# Patient Record
Sex: Female | Born: 1960 | Race: White | Hispanic: No | Smoking: Former smoker
Health system: Southern US, Community
[De-identification: ages and names within clinical notes are randomized; demographics above are authoritative.]

## PROBLEM LIST (undated history)

## (undated) DIAGNOSIS — K219 Gastro-esophageal reflux disease without esophagitis: Secondary | ICD-10-CM

## (undated) DIAGNOSIS — F32A Depression, unspecified: Secondary | ICD-10-CM

## (undated) DIAGNOSIS — C801 Malignant (primary) neoplasm, unspecified: Secondary | ICD-10-CM

## (undated) DIAGNOSIS — F319 Bipolar disorder, unspecified: Secondary | ICD-10-CM

## (undated) DIAGNOSIS — I639 Cerebral infarction, unspecified: Secondary | ICD-10-CM

## (undated) DIAGNOSIS — F329 Major depressive disorder, single episode, unspecified: Secondary | ICD-10-CM

## (undated) DIAGNOSIS — F419 Anxiety disorder, unspecified: Secondary | ICD-10-CM

## (undated) DIAGNOSIS — Z972 Presence of dental prosthetic device (complete) (partial): Secondary | ICD-10-CM

## (undated) HISTORY — PX: ABDOMINAL HYSTERECTOMY: SHX81

## (undated) HISTORY — PX: TONSILLECTOMY: SUR1361

## (undated) HISTORY — PX: BREAST SURGERY: SHX581

## (undated) HISTORY — PX: APPENDECTOMY: SHX54

## (undated) HISTORY — PX: CARPAL TUNNEL RELEASE: SHX101

## (undated) HISTORY — PX: SHOULDER ARTHROSCOPY W/ ROTATOR CUFF REPAIR: SHX2400

---

## 2001-02-13 ENCOUNTER — Inpatient Hospital Stay (HOSPITAL_COMMUNITY): Admission: EM | Admit: 2001-02-13 | Discharge: 2001-02-14 | Payer: Self-pay | Admitting: *Deleted

## 2001-02-13 ENCOUNTER — Encounter: Payer: Self-pay | Admitting: Emergency Medicine

## 2001-02-14 ENCOUNTER — Encounter: Payer: Self-pay | Admitting: Cardiology

## 2001-04-01 ENCOUNTER — Ambulatory Visit (HOSPITAL_COMMUNITY): Admission: RE | Admit: 2001-04-01 | Discharge: 2001-04-01 | Payer: Self-pay | Admitting: Cardiology

## 2001-04-01 ENCOUNTER — Encounter: Payer: Self-pay | Admitting: Cardiology

## 2001-12-06 ENCOUNTER — Encounter: Payer: Self-pay | Admitting: Emergency Medicine

## 2001-12-06 ENCOUNTER — Emergency Department (HOSPITAL_COMMUNITY): Admission: EM | Admit: 2001-12-06 | Discharge: 2001-12-06 | Payer: Self-pay | Admitting: Emergency Medicine

## 2001-12-08 ENCOUNTER — Encounter: Payer: Self-pay | Admitting: Orthopedic Surgery

## 2001-12-08 ENCOUNTER — Ambulatory Visit (HOSPITAL_COMMUNITY): Admission: RE | Admit: 2001-12-08 | Discharge: 2001-12-08 | Payer: Self-pay | Admitting: Orthopedic Surgery

## 2001-12-10 ENCOUNTER — Emergency Department (HOSPITAL_COMMUNITY): Admission: EM | Admit: 2001-12-10 | Discharge: 2001-12-10 | Payer: Self-pay | Admitting: Emergency Medicine

## 2001-12-21 ENCOUNTER — Ambulatory Visit (HOSPITAL_BASED_OUTPATIENT_CLINIC_OR_DEPARTMENT_OTHER): Admission: RE | Admit: 2001-12-21 | Discharge: 2001-12-21 | Payer: Self-pay | Admitting: Orthopedic Surgery

## 2004-09-06 ENCOUNTER — Inpatient Hospital Stay (HOSPITAL_COMMUNITY): Admission: AD | Admit: 2004-09-06 | Discharge: 2004-09-06 | Payer: Self-pay | Admitting: *Deleted

## 2004-09-11 ENCOUNTER — Encounter: Admission: RE | Admit: 2004-09-11 | Discharge: 2004-09-11 | Payer: Self-pay | Admitting: Internal Medicine

## 2004-10-06 ENCOUNTER — Inpatient Hospital Stay (HOSPITAL_COMMUNITY): Admission: EM | Admit: 2004-10-06 | Discharge: 2004-10-08 | Payer: Self-pay | Admitting: Emergency Medicine

## 2006-03-17 IMAGING — CR DG CHEST 1V PORT
1 series · 1 of 1 positions shown · non-contrast
Comparison: none

CLINICAL DATA: Chest pain, shortness of breath, smoking history.  
 CHEST PORTABLE - 1 VIEW - 9741 HOURS:
 Artifact overlies the chest.  Heart size is normal.  The mediastinum is unremarkable.  The left lung is clear.  Question if there is density in the right upper lobe.  This could be infiltrate or mass.  Two view chest radiography recommended when possible.

[view not recorded]
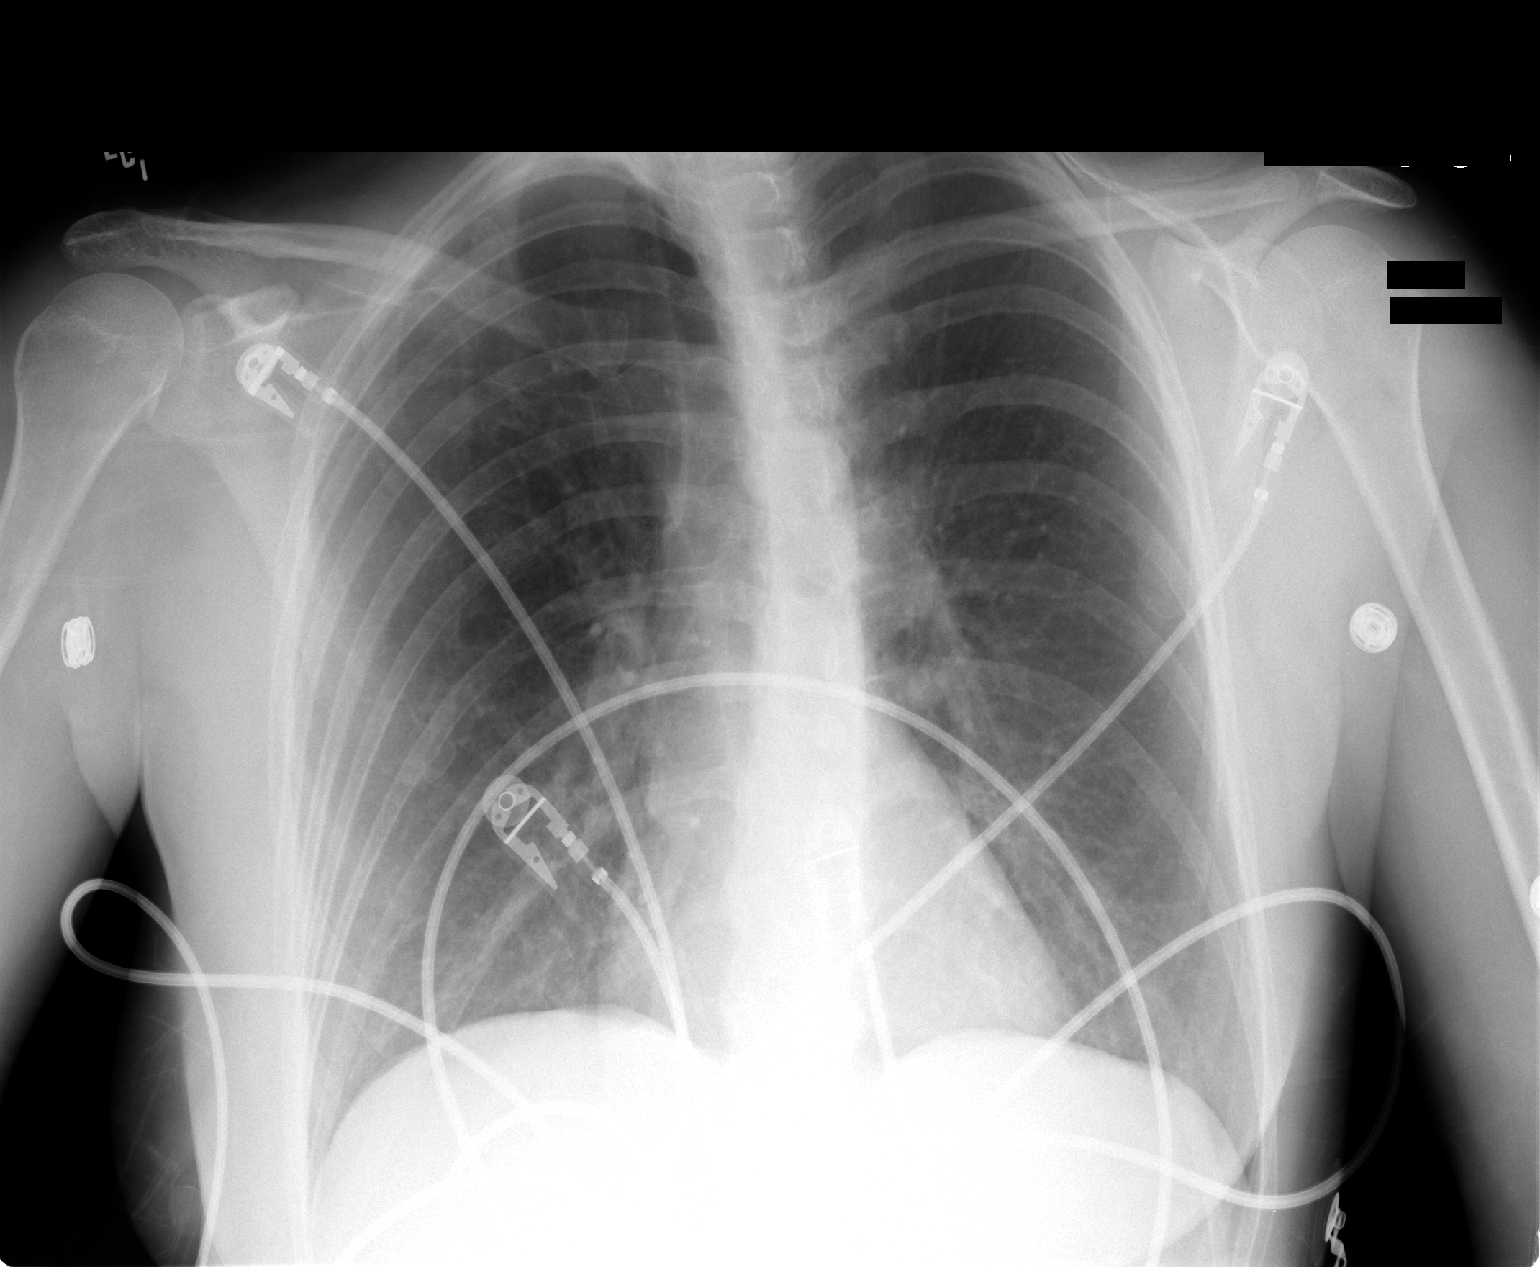

[1 of 1 positions shown; findings below may reference images not displayed]

## 2013-11-23 ENCOUNTER — Encounter (HOSPITAL_BASED_OUTPATIENT_CLINIC_OR_DEPARTMENT_OTHER): Payer: Self-pay | Admitting: *Deleted

## 2013-11-28 ENCOUNTER — Encounter (HOSPITAL_BASED_OUTPATIENT_CLINIC_OR_DEPARTMENT_OTHER)
Admission: RE | Disposition: A | Discharge status: Home / Self Care | Payer: Self-pay | Source: Ambulatory Visit | Attending: Specialist

## 2013-11-28 ENCOUNTER — Encounter (HOSPITAL_BASED_OUTPATIENT_CLINIC_OR_DEPARTMENT_OTHER): Payer: Medicare Other | Admitting: Anesthesiology

## 2013-11-28 ENCOUNTER — Ambulatory Visit (HOSPITAL_BASED_OUTPATIENT_CLINIC_OR_DEPARTMENT_OTHER): Payer: Medicare Other | Admitting: Anesthesiology

## 2013-11-28 ENCOUNTER — Ambulatory Visit (HOSPITAL_BASED_OUTPATIENT_CLINIC_OR_DEPARTMENT_OTHER)
Admission: RE | Admit: 2013-11-28 | Discharge: 2013-11-28 | Disposition: A | Discharge status: Home / Self Care | Payer: Medicare Other | Source: Ambulatory Visit | Attending: Specialist | Admitting: Specialist

## 2013-11-28 ENCOUNTER — Encounter (HOSPITAL_BASED_OUTPATIENT_CLINIC_OR_DEPARTMENT_OTHER): Payer: Self-pay

## 2013-11-28 DIAGNOSIS — Z9010 Acquired absence of unspecified breast and nipple: Secondary | ICD-10-CM | POA: Insufficient documentation

## 2013-11-28 DIAGNOSIS — F411 Generalized anxiety disorder: Secondary | ICD-10-CM | POA: Insufficient documentation

## 2013-11-28 DIAGNOSIS — F3289 Other specified depressive episodes: Secondary | ICD-10-CM | POA: Insufficient documentation

## 2013-11-28 DIAGNOSIS — F329 Major depressive disorder, single episode, unspecified: Secondary | ICD-10-CM | POA: Insufficient documentation

## 2013-11-28 DIAGNOSIS — Z87891 Personal history of nicotine dependence: Secondary | ICD-10-CM | POA: Insufficient documentation

## 2013-11-28 DIAGNOSIS — Z853 Personal history of malignant neoplasm of breast: Secondary | ICD-10-CM | POA: Insufficient documentation

## 2013-11-28 DIAGNOSIS — K219 Gastro-esophageal reflux disease without esophagitis: Secondary | ICD-10-CM | POA: Insufficient documentation

## 2013-11-28 DIAGNOSIS — N650 Deformity of reconstructed breast: Secondary | ICD-10-CM | POA: Insufficient documentation

## 2013-11-28 HISTORY — DX: Depression, unspecified: F32.A

## 2013-11-28 HISTORY — DX: Anxiety disorder, unspecified: F41.9

## 2013-11-28 HISTORY — DX: Bipolar disorder, unspecified: F31.9

## 2013-11-28 HISTORY — PX: BREAST CAPSULECTOMY WITH IMPLANT EXCHANGE: SHX5592

## 2013-11-28 HISTORY — DX: Gastro-esophageal reflux disease without esophagitis: K21.9

## 2013-11-28 HISTORY — DX: Major depressive disorder, single episode, unspecified: F32.9

## 2013-11-28 HISTORY — DX: Malignant (primary) neoplasm, unspecified: C80.1

## 2013-11-28 LAB — POCT HEMOGLOBIN-HEMACUE: Hemoglobin: 12.1 g/dL (ref 12.0–15.0)

## 2013-11-28 SURGERY — CAPSULECTOMY, BREAST, WITH REPLACEMENT OF IMPLANT
Anesthesia: General | Site: Breast | Laterality: Bilateral

## 2013-11-28 MED ORDER — MIDAZOLAM HCL 5 MG/5ML IJ SOLN
INTRAMUSCULAR | Status: DC | PRN
  Administered 2013-11-28: 1 mg via INTRAVENOUS

## 2013-11-28 MED ORDER — ROCURONIUM BROMIDE 100 MG/10ML IV SOLN
INTRAVENOUS | Status: DC | PRN
  Administered 2013-11-28: 30 mg via INTRAVENOUS

## 2013-11-28 MED ORDER — MIDAZOLAM HCL 2 MG/2ML IJ SOLN
INTRAMUSCULAR | Status: AC
  Filled 2013-11-28: qty 2

## 2013-11-28 MED ORDER — GLYCOPYRROLATE 0.2 MG/ML IJ SOLN
INTRAMUSCULAR | Status: DC | PRN
  Administered 2013-11-28: .6 mg via INTRAVENOUS

## 2013-11-28 MED ORDER — OXYCODONE HCL 5 MG/5ML PO SOLN: 5.0000 mg | Freq: Once | ORAL | Status: AC | PRN

## 2013-11-28 MED ORDER — NEOSTIGMINE METHYLSULFATE 10 MG/10ML IV SOLN
INTRAVENOUS | Status: DC | PRN
  Administered 2013-11-28: 4 mg via INTRAVENOUS

## 2013-11-28 MED ORDER — FENTANYL CITRATE 0.05 MG/ML IJ SOLN
INTRAMUSCULAR | Status: AC
  Filled 2013-11-28: qty 6

## 2013-11-28 MED ORDER — HYDROMORPHONE HCL PF 1 MG/ML IJ SOLN
INTRAMUSCULAR | Status: AC
  Filled 2013-11-28: qty 1

## 2013-11-28 MED ORDER — SODIUM CHLORIDE 0.9 % IV SOLN
INTRAVENOUS | Status: DC | PRN
  Administered 2013-11-28: 50 mL via INTRAMUSCULAR

## 2013-11-28 MED ORDER — ONDANSETRON HCL 4 MG/2ML IJ SOLN
INTRAMUSCULAR | Status: DC | PRN
  Administered 2013-11-28: 4 mg via INTRAVENOUS

## 2013-11-28 MED ORDER — PROPOFOL 10 MG/ML IV BOLUS
INTRAVENOUS | Status: DC | PRN
  Administered 2013-11-28: 200 mg via INTRAVENOUS

## 2013-11-28 MED ORDER — CEFAZOLIN SODIUM-DEXTROSE 2-3 GM-% IV SOLR
INTRAVENOUS | Status: AC
  Filled 2013-11-28: qty 50

## 2013-11-28 MED ORDER — LIDOCAINE-EPINEPHRINE 0.5 %-1:200000 IJ SOLN
INTRAMUSCULAR | Status: DC | PRN
Start: 2013-11-28 — End: 2013-11-28
  Administered 2013-11-28: 50 mL

## 2013-11-28 MED ORDER — LACTATED RINGERS IV SOLN
INTRAVENOUS | Status: DC
  Administered 2013-11-28 (×2): via INTRAVENOUS

## 2013-11-28 MED ORDER — FENTANYL CITRATE 0.05 MG/ML IJ SOLN
INTRAMUSCULAR | Status: DC | PRN
Start: 2013-11-28 — End: 2013-11-28
  Administered 2013-11-28: 100 ug via INTRAVENOUS

## 2013-11-28 MED ORDER — HYDROMORPHONE HCL PF 1 MG/ML IJ SOLN
0.2500 mg | INTRAMUSCULAR | Status: DC | PRN
Start: 2013-11-28 — End: 2013-11-28
  Administered 2013-11-28 (×4): 0.5 mg via INTRAVENOUS

## 2013-11-28 MED ORDER — OXYCODONE HCL 5 MG PO TABS
ORAL_TABLET | ORAL | Status: AC
  Filled 2013-11-28: qty 1

## 2013-11-28 MED ORDER — CEFAZOLIN SODIUM-DEXTROSE 2-3 GM-% IV SOLR
2.0000 g | INTRAVENOUS | Status: AC
  Administered 2013-11-28: 2 g via INTRAVENOUS

## 2013-11-28 MED ORDER — LIDOCAINE HCL (CARDIAC) 20 MG/ML IV SOLN
INTRAVENOUS | Status: DC | PRN
  Administered 2013-11-28: 80 mg via INTRAVENOUS

## 2013-11-28 MED ORDER — DEXAMETHASONE SODIUM PHOSPHATE 4 MG/ML IJ SOLN
INTRAMUSCULAR | Status: DC | PRN
  Administered 2013-11-28: 10 mg via INTRAVENOUS

## 2013-11-28 MED ORDER — MIDAZOLAM HCL 2 MG/2ML IJ SOLN: 1.0000 mg | INTRAMUSCULAR | Status: DC | PRN

## 2013-11-28 MED ORDER — ONDANSETRON HCL 4 MG/2ML IJ SOLN: 4.0000 mg | Freq: Once | INTRAMUSCULAR | Status: DC | PRN

## 2013-11-28 MED ORDER — FENTANYL CITRATE 0.05 MG/ML IJ SOLN: 50.0000 ug | INTRAMUSCULAR | Status: DC | PRN

## 2013-11-28 MED ORDER — OXYCODONE HCL 5 MG PO TABS
5.0000 mg | ORAL_TABLET | Freq: Once | ORAL | Status: AC | PRN
  Administered 2013-11-28: 5 mg via ORAL

## 2013-11-28 SURGICAL SUPPLY — 63 items
BAG DECANTER FOR FLEXI CONT (MISCELLANEOUS) ×3 IMPLANT
BENZOIN TINCTURE PRP APPL 2/3 (GAUZE/BANDAGES/DRESSINGS) ×3 IMPLANT
BLADE KNIFE PERSONA 10 (BLADE) ×3 IMPLANT
BLADE KNIFE PERSONA 15 (BLADE) ×3 IMPLANT
CANISTER SUCT 1200ML W/VALVE (MISCELLANEOUS) ×3 IMPLANT
COVER MAYO STAND STRL (DRAPES) ×3 IMPLANT
COVER TABLE BACK 60X90 (DRAPES) ×3 IMPLANT
DECANTER SPIKE VIAL GLASS SM (MISCELLANEOUS) ×3 IMPLANT
DRAIN CHANNEL 10M FLAT 3/4 FLT (DRAIN) ×3 IMPLANT
DRAIN PENROSE 1/4X12 LTX STRL (WOUND CARE) IMPLANT
DRAPE LAPAROSCOPIC ABDOMINAL (DRAPES) ×3 IMPLANT
DRSG PAD ABDOMINAL 8X10 ST (GAUZE/BANDAGES/DRESSINGS) ×6 IMPLANT
ELECT BLADE 6.5 .24CM SHAFT (ELECTRODE) IMPLANT
ELECT REM PT RETURN 9FT ADLT (ELECTROSURGICAL) ×3
ELECTRODE REM PT RTRN 9FT ADLT (ELECTROSURGICAL) ×1 IMPLANT
EVACUATOR SILICONE 100CC (DRAIN) ×3 IMPLANT
FILTER 7/8 IN (FILTER) ×3 IMPLANT
GAUZE SPONGE 4X4 12PLY STRL (GAUZE/BANDAGES/DRESSINGS) ×3 IMPLANT
GAUZE XEROFORM 5X9 LF (GAUZE/BANDAGES/DRESSINGS) ×3 IMPLANT
GLOVE BIO SURGEON STRL SZ 6.5 (GLOVE) ×2 IMPLANT
GLOVE BIO SURGEONS STRL SZ 6.5 (GLOVE) ×1
GLOVE BIOGEL M STRL SZ7.5 (GLOVE) ×3 IMPLANT
GLOVE BIOGEL PI IND STRL 8 (GLOVE) ×1 IMPLANT
GLOVE BIOGEL PI INDICATOR 8 (GLOVE) ×2
GLOVE ECLIPSE 7.0 STRL STRAW (GLOVE) ×3 IMPLANT
GOWN STRL REUS W/ TWL LRG LVL3 (GOWN DISPOSABLE) IMPLANT
GOWN STRL REUS W/TWL LRG LVL3 (GOWN DISPOSABLE)
GOWN STRL REUS W/TWL XL LVL3 (GOWN DISPOSABLE) ×6 IMPLANT
IMPL GEL HP 550CC (Breast) ×2 IMPLANT
IMPLANT GEL HP 550CC (Breast) ×6 IMPLANT
IV NS 500ML (IV SOLUTION) ×8
IV NS 500ML BAXH (IV SOLUTION) ×4 IMPLANT
KIT FILL SYSTEM UNIVERSAL (SET/KITS/TRAYS/PACK) IMPLANT
NDL SAFETY ECLIPSE 18X1.5 (NEEDLE) IMPLANT
NEEDLE HYPO 18GX1.5 SHARP (NEEDLE)
NEEDLE HYPO 25X1 1.5 SAFETY (NEEDLE) ×3 IMPLANT
NEEDLE SPNL 18GX3.5 QUINCKE PK (NEEDLE) IMPLANT
NS IRRIG 1000ML POUR BTL (IV SOLUTION) IMPLANT
PACK BASIN DAY SURGERY FS (CUSTOM PROCEDURE TRAY) ×3 IMPLANT
PEN SKIN MARKING BROAD TIP (MISCELLANEOUS) ×3 IMPLANT
PIN SAFETY STERILE (MISCELLANEOUS) IMPLANT
SLEEVE SCD COMPRESS KNEE MED (MISCELLANEOUS) ×3 IMPLANT
SPONGE LAP 18X18 X RAY DECT (DISPOSABLE) ×6 IMPLANT
STRIP SUTURE WOUND CLOSURE 1/2 (SUTURE) IMPLANT
SUT MNCRL AB 3-0 PS2 18 (SUTURE) ×6 IMPLANT
SUT MON AB 2-0 CT1 36 (SUTURE) ×6 IMPLANT
SUT MON AB 5-0 PS2 18 (SUTURE) IMPLANT
SUT PROLENE 3 0 PS 2 (SUTURE) IMPLANT
SYR 20CC LL (SYRINGE) IMPLANT
SYR 50ML LL SCALE MARK (SYRINGE) ×6 IMPLANT
SYR BULB IRRIGATION 50ML (SYRINGE) ×3 IMPLANT
SYR CONTROL 10ML LL (SYRINGE) ×3 IMPLANT
TAPE HYPAFIX 6 X30' (GAUZE/BANDAGES/DRESSINGS) ×1
TAPE HYPAFIX 6X30 (GAUZE/BANDAGES/DRESSINGS) ×2 IMPLANT
TAPE MEASURE 72IN RETRACT (INSTRUMENTS)
TAPE MEASURE LINEN 72IN RETRCT (INSTRUMENTS) IMPLANT
TOWEL OR 17X24 6PK STRL BLUE (TOWEL DISPOSABLE) ×12 IMPLANT
TRAY DSU PREP LF (CUSTOM PROCEDURE TRAY) ×3 IMPLANT
TUBE CONNECTING 20'X1/4 (TUBING) ×1
TUBE CONNECTING 20X1/4 (TUBING) ×2 IMPLANT
UNDERPAD 30X30 INCONTINENT (UNDERPADS AND DIAPERS) ×6 IMPLANT
VAC PENCILS W/TUBING CLEAR (MISCELLANEOUS) ×3 IMPLANT
YANKAUER SUCT BULB TIP NO VENT (SUCTIONS) ×3 IMPLANT

## 2013-11-28 NOTE — Discharge Instructions (Signed)
Activity (include date of return to work if known) As tolerated: NO showers NO driving No heavy activities  Diet:regular No restrictions:  Wound Care: Keep dressing clean & dry  Do not change dressings For Abdominoplasties wear abdominal binder Special Instructions: Do not raise arms up  . Call Doctor if any unusual problems occur such as pain, excessive Bleeding, unrelieved Nausea/vomiting, Fever &/or chills When lying down, keep head elevated on 2-3 pillows or back-rest  Follow-up appointment: Please call the office.  The patient received discharge instruction from:___________________________________________      Patient signature ________________________________________ / Date___________    Signature of individual providing instructions/ Date________________               Post Anesthesia Home Care Instructions  Activity: Get plenty of rest for the remainder of the day. A responsible adult should stay with you for 24 hours following the procedure.  For the next 24 hours, DO NOT: -Drive a car -Paediatric nurse -Drink alcoholic beverages -Take any medication unless instructed by your physician -Make any legal decisions or sign important papers.  Meals: Start with liquid foods such as gelatin or soup. Progress to regular foods as tolerated. Avoid greasy, spicy, heavy foods. If nausea and/or vomiting occur, drink only clear liquids until the nausea and/or vomiting subsides. Call your physician if vomiting continues.  Special Instructions/Symptoms: Your throat may feel dry or sore from the anesthesia or the breathing tube placed in your throat during surgery. If this causes discomfort, gargle with warm salt water. The discomfort should disappear within 24 hours.

## 2013-11-28 NOTE — Anesthesia Procedure Notes (Signed)
Procedure Name: Intubation Performed by: Marrianne Sica W Pre-anesthesia Checklist: Patient identified, Timeout performed, Emergency Drugs available, Suction available and Patient being monitored Patient Re-evaluated:Patient Re-evaluated prior to inductionOxygen Delivery Method: Circle system utilized Preoxygenation: Pre-oxygenation with 100% oxygen Intubation Type: IV induction Ventilation: Mask ventilation without difficulty Laryngoscope Size: Miller and 2 Grade View: Grade I Tube type: Oral Tube size: 7.0 mm Number of attempts: 1 Airway Equipment and Method: Stylet Secured at: 22 cm Tube secured with: Tape Dental Injury: Teeth and Oropharynx as per pre-operative assessment      

## 2013-11-28 NOTE — Anesthesia Postprocedure Evaluation (Signed)
  Anesthesia Post-op Note  Patient: Annette King  Procedure(s) Performed: Procedure(s): REMOVAL OF BILATERAL IMPLANT POSSIBLE CAPSULECTOMIES AND IMPLANT REPLACEMENTS , REVISION OF LEFT INFRA MAMMARY FOLD,  PLICATION BILATERAL   (Bilateral)  Patient Location: PACU  Anesthesia Type:General  Level of Consciousness: awake, alert  and oriented  Airway and Oxygen Therapy: Patient Spontanous Breathing  Post-op Pain: mild  Post-op Assessment: Post-op Vital signs reviewed  Post-op Vital Signs: Reviewed  Last Vitals:  Filed Vitals:   11/28/13 1251  BP:   Pulse: 80  Temp:   Resp: 16    Complications: No apparent anesthesia complications

## 2013-11-28 NOTE — Anesthesia Preprocedure Evaluation (Signed)
Anesthesia Evaluation  Patient identified by MRN, date of birth, ID band Patient awake    Reviewed: Allergy & Precautions, H&P , NPO status , Patient's Chart, lab work & pertinent test results  Airway Mallampati: I TM Distance: >3 FB Neck ROM: Full    Dental  (+) Teeth Intact, Dental Advisory Given   Pulmonary former smoker,  breath sounds clear to auscultation        Cardiovascular Rhythm:Regular Rate:Normal     Neuro/Psych    GI/Hepatic GERD-  Medicated and Controlled,  Endo/Other    Renal/GU      Musculoskeletal   Abdominal   Peds  Hematology   Anesthesia Other Findings   Reproductive/Obstetrics                           Anesthesia Physical Anesthesia Plan  ASA: II  Anesthesia Plan: General   Post-op Pain Management:    Induction: Intravenous  Airway Management Planned: LMA  Additional Equipment:   Intra-op Plan:   Post-operative Plan: Extubation in OR  Informed Consent: I have reviewed the patients History and Physical, chart, labs and discussed the procedure including the risks, benefits and alternatives for the proposed anesthesia with the patient or authorized representative who has indicated his/her understanding and acceptance.   Dental advisory given  Plan Discussed with: CRNA, Anesthesiologist and Surgeon  Anesthesia Plan Comments:         Anesthesia Quick Evaluation  

## 2013-11-28 NOTE — Brief Op Note (Signed)
11/28/2013  11:23 AM  PATIENT:  Devoria Albe  53 y.o. female  PRE-OPERATIVE DIAGNOSIS:  Malignant neoplasm of breast (female), unspecified site bilateral  Acquired absence of breast and nipple   POST-OPERATIVE DIAGNOSIS:  Malignant neoplasm of breast (female), unspecified site bilateral   PROCEDURE:  Procedure(s): REMOVAL OF BILATERAL IMPLANT POSSIBLE CAPSULECTOMIES AND IMPLANT REPLACEMENTS , REVISION OF LEFT INFRA MAMMARY FOLD,  PLICATION BILATERAL   (Bilateral)  SURGEON:  Surgeon(s) and Role:    * [name], MD - Primary  PHYSICIAN ASSISTANT:   ASSISTANTS: none   ANESTHESIA:   general  EBL:  Total I/O In: 800 [I.V.:800] Out: -   BLOOD ADMINISTERED:none  DRAINS: none   LOCAL MEDICATIONS USED:  LIDOCAINE   SPECIMEN:  Excision  DISPOSITION OF SPECIMEN:  PATHOLOGY  COUNTS:  YES  TOURNIQUET:  * No tourniquets in log *  DICTATION: .Other Dictation: Dictation Number [id number]  PLAN OF CARE: Discharge to home after PACU  PATIENT DISPOSITION:  PACU - hemodynamically stable.   Delay start of Pharmacological VTE agent (>24hrs) due to surgical blood loss or risk of bleeding: not applicable

## 2013-11-28 NOTE — H&P (Signed)
Annette King is an 53 y.o. female.   Chief Complaint: SP bilateral breast reconstructions by Dr Percell Miller in [facility] has increased crinkling of the implants capsular formations and increased laxity of the lateral dissections.Possible deflation on right HPI: Pt thought that she had gel implants placed but has noticed increased wrinkling of the contour of both with possible deflation on the right side. And capsular formations and deviation of mboth implants toward the axillary regions.  Past Medical History  Diagnosis Date  . Cancer     lt breast cancer  . Depression   . Anxiety   . Bipolar disorder   . GERD (gastroesophageal reflux disease)     Past Surgical History  Procedure Laterality Date  . Tonsillectomy    . Abdominal hysterectomy    . Appendectomy    . Breast surgery      bil mastectomies 2014 at [facility]  . Carpal tunnel release Bilateral   . Shoulder arthroscopy w/ rotator cuff repair Bilateral     History reviewed. No pertinent family history. Social History:  reports that she quit smoking about 9 months ago. She does not have any smokeless tobacco history on file. She reports that she does not drink alcohol or use illicit drugs.  Allergies: No Known Allergies  Medications Prior to Admission  Medication Sig Dispense Refill  . clonazePAM (KLONOPIN) 2 MG tablet Take 2 mg by mouth 2 (two) times daily.      . DULoxetine (CYMBALTA) 60 MG capsule Take 60 mg by mouth 2 (two) times daily.      Marland Kitchen lamoTRIgine (LAMICTAL) 100 MG tablet Take 100 mg by mouth daily.      Marland Kitchen omeprazole (PRILOSEC) 40 MG capsule Take 40 mg by mouth daily.      . traZODone (DESYREL) 100 MG tablet Take 100 mg by mouth at bedtime.        No results found for this or any previous visit (from the past 48 hour(s)). No results found.  Review of Systems  Constitutional: Negative.   HENT: Negative.   Eyes: Negative.   Respiratory: Negative.   Cardiovascular: Negative.   Gastrointestinal:  Negative.   Genitourinary: Negative.   Musculoskeletal: Negative.   Skin: Negative.   Neurological: Negative.   Endo/Heme/Allergies: Negative.   Psychiatric/Behavioral: Negative.     Blood pressure 97/64, pulse 67, temperature 98.2 F (36.8 C), temperature source Oral, resp. rate 20, height 5' 2.5" (1.588 m), weight 51.71 kg (114 lb), SpO2 97.00%. Physical Exam   Assessment/Plan Wrinkling of the right and left implants which seem tobe saline not gels with capsular formations and deflation on the right. Increased deviation of the implants laterally  For bilteral capsulotomies with implant exchanges replacement of saline implants with Mentor gels and plication of both dissected spaces to improve contour and aesthetics  [name] 11/28/2013, 9:55 AM

## 2013-11-28 NOTE — Transfer of Care (Signed)
Immediate Anesthesia Transfer of Care Note  Patient: Annette King  Procedure(s) Performed: Procedure(s): REMOVAL OF BILATERAL IMPLANT POSSIBLE CAPSULECTOMIES AND IMPLANT REPLACEMENTS , REVISION OF LEFT INFRA MAMMARY FOLD,  PLICATION BILATERAL   (Bilateral)  Patient Location: PACU  Anesthesia Type:General  Level of Consciousness: awake and sedated  Airway & Oxygen Therapy: Patient Spontanous Breathing and Patient connected to face mask oxygen  Post-op Assessment: Report given to PACU RN and Post -op Vital signs reviewed and stable  Post vital signs: Reviewed and stable  Complications: No apparent anesthesia complications

## 2013-11-29 NOTE — Op Note (Signed)
NAME:  BREELLA, VANOSTRAND NO.:  [id number]  MEDICAL RECORD NO.:  [id number]  LOCATION:                                 FACILITY:  PHYSICIAN:  Berneta Sages L. Towanda Malkin, M.D.DATE OF BIRTH:  1961/04/06  DATE OF PROCEDURE:  11/28/2013 DATE OF DISCHARGE:  11/28/2013                              OPERATIVE REPORT   INDICATION:  This 53 year old lady status post bilateral breast cancer, had previous reconstructions done elsewhere in [facility].  The patient has __________ on both sides as well as dislocation of the implant to the right side into the axillary area and has capsule formations bilaterally to which they have these changed out to gel implants.  All procedures were explained to the patient, who understands all the risk factors, complications, potentials, etc. and wishes to pursue. Preoperatively, the patient was sat up and drawn for the midline of the chest as well as the right and left anterior axillary lines.  The patient had a concavity involving her medial portion of the right side from the implant __________ bilateral capsule formation __________ on the right and left.  She also has increased __________ when she sits up.  PROCEDURES DONE:  Exploration, bilateral capsulotomies without scar revision on the left side, release of the right inframammary fold area reduced and lowered it down to 1.5 cm to match her left side, implant exchanges.  ANESTHESIA:  General.  The patient underwent general anesthesia, intubated orally.  Prep was done to the chest, breast areas in routine fashion using Hibiclens soap and solution and walled off with sterile towels and drapes so as to make a sterile field.  The 1.25% Xylocaine with epinephrine was injected locally to the right and left transverse incision areas using 1:400,000 concentration total of 50 mL.  A #15 blade was used to enter and make an incision.  This was carried out through the skin and subcutaneous tissue to  underlying pectoralis major muscle.  The muscles were __________ opened in direction of its fibers.  I was able to remove the previously placed implants.  They were taken off the table.  Examination of the pocket revealed no pathology both visually as well as palpable.  The pocket was irrigated out with copious amounts of saline and capsular formations were then freed up.  The capsulotomy was circumferentially on the right and left side.  On the right side, the dissection was carried over the serratus pocket approximately 5 cm passed it, so I was able to plicate that __________ with a running 2-0 Monocryl suture from the axillary area all the way down laterally.  On the left side, the same thing was done.  After this, the inframammary fold on the right side was low at approximately 1.5 cm using lighter retraction and used the Bovie anticoagulation.  Hemostasis was maintained and then the pocket was reconstructed with new implants __________ type implants __________ reference #[id number] __________, serial number on the left is [id number], serial number on the right [id number].  These are total saline implants with a total of 550 mL.  These fit very nicely on both sides. Muscles then repaired back with 2-0 Monocryl x2 layers subcutaneously and running subcuticular  stitch of 3-0 Monocryl.  The wounds were cleansed.  Steri-Strips and soft dressing were applied to all the areas.  ESTIMATED BLOOD LOSS:  Less than 100 mL.  COMPLICATIONS:  None.     Odella Aquas. Towanda Malkin, M.D.     Elie Confer  D:  11/28/2013  T:  11/29/2013  Job:  [id number]

## 2013-11-30 ENCOUNTER — Encounter (HOSPITAL_BASED_OUTPATIENT_CLINIC_OR_DEPARTMENT_OTHER): Payer: Self-pay | Admitting: Specialist

## 2014-02-02 ENCOUNTER — Encounter (HOSPITAL_BASED_OUTPATIENT_CLINIC_OR_DEPARTMENT_OTHER): Payer: Self-pay | Admitting: *Deleted

## 2014-02-02 NOTE — Progress Notes (Signed)
No labs needed

## 2014-02-06 ENCOUNTER — Ambulatory Visit (HOSPITAL_BASED_OUTPATIENT_CLINIC_OR_DEPARTMENT_OTHER): Payer: Medicare Other | Admitting: Anesthesiology

## 2014-02-06 ENCOUNTER — Encounter (HOSPITAL_BASED_OUTPATIENT_CLINIC_OR_DEPARTMENT_OTHER): Payer: Self-pay | Admitting: *Deleted

## 2014-02-06 ENCOUNTER — Ambulatory Visit (HOSPITAL_BASED_OUTPATIENT_CLINIC_OR_DEPARTMENT_OTHER)
Admission: RE | Admit: 2014-02-06 | Discharge: 2014-02-06 | Disposition: A | Discharge status: Home / Self Care | Payer: Medicare Other | Source: Ambulatory Visit | Attending: Specialist | Admitting: Specialist

## 2014-02-06 ENCOUNTER — Encounter (HOSPITAL_BASED_OUTPATIENT_CLINIC_OR_DEPARTMENT_OTHER)
Admission: RE | Disposition: A | Discharge status: Home / Self Care | Payer: Self-pay | Source: Ambulatory Visit | Attending: Specialist

## 2014-02-06 ENCOUNTER — Encounter (HOSPITAL_BASED_OUTPATIENT_CLINIC_OR_DEPARTMENT_OTHER): Payer: Medicare Other | Admitting: Anesthesiology

## 2014-02-06 DIAGNOSIS — Z853 Personal history of malignant neoplasm of breast: Secondary | ICD-10-CM | POA: Insufficient documentation

## 2014-02-06 DIAGNOSIS — Z87891 Personal history of nicotine dependence: Secondary | ICD-10-CM | POA: Diagnosis not present

## 2014-02-06 DIAGNOSIS — K219 Gastro-esophageal reflux disease without esophagitis: Secondary | ICD-10-CM | POA: Insufficient documentation

## 2014-02-06 DIAGNOSIS — Z421 Encounter for breast reconstruction following mastectomy: Secondary | ICD-10-CM | POA: Insufficient documentation

## 2014-02-06 DIAGNOSIS — Z9010 Acquired absence of unspecified breast and nipple: Secondary | ICD-10-CM | POA: Insufficient documentation

## 2014-02-06 DIAGNOSIS — F319 Bipolar disorder, unspecified: Secondary | ICD-10-CM | POA: Diagnosis not present

## 2014-02-06 DIAGNOSIS — F411 Generalized anxiety disorder: Secondary | ICD-10-CM | POA: Diagnosis not present

## 2014-02-06 HISTORY — PX: BREAST RECONSTRUCTION: SHX9

## 2014-02-06 HISTORY — DX: Presence of dental prosthetic device (complete) (partial): Z97.2

## 2014-02-06 LAB — POCT HEMOGLOBIN-HEMACUE: HEMOGLOBIN: 11.5 g/dL — AB (ref 12.0–15.0)

## 2014-02-06 SURGERY — AEROLA/NIPPLE RECONSTRUCTION WITH GRAFT
Anesthesia: General | Site: Breast | Laterality: Bilateral

## 2014-02-06 MED ORDER — LIDOCAINE HCL (CARDIAC) 20 MG/ML IV SOLN
INTRAVENOUS | Status: DC | PRN
  Administered 2014-02-06: 50 mg via INTRAVENOUS

## 2014-02-06 MED ORDER — MIDAZOLAM HCL 2 MG/2ML IJ SOLN: 1.0000 mg | INTRAMUSCULAR | Status: DC | PRN

## 2014-02-06 MED ORDER — LIDOCAINE-EPINEPHRINE 0.5 %-1:200000 IJ SOLN
INTRAMUSCULAR | Status: DC | PRN
  Administered 2014-02-06: 50 mL

## 2014-02-06 MED ORDER — LACTATED RINGERS IV SOLN
INTRAVENOUS | Status: DC
  Administered 2014-02-06: 08:00:00 via INTRAVENOUS

## 2014-02-06 MED ORDER — FENTANYL CITRATE 0.05 MG/ML IJ SOLN
INTRAMUSCULAR | Status: DC | PRN
  Administered 2014-02-06: 75 ug via INTRAVENOUS

## 2014-02-06 MED ORDER — HYDROMORPHONE HCL PF 1 MG/ML IJ SOLN
INTRAMUSCULAR | Status: AC
  Filled 2014-02-06: qty 1

## 2014-02-06 MED ORDER — OXYCODONE HCL 5 MG PO TABS
5.0000 mg | ORAL_TABLET | Freq: Once | ORAL | Status: AC | PRN
  Administered 2014-02-06: 5 mg via ORAL

## 2014-02-06 MED ORDER — CEFAZOLIN SODIUM-DEXTROSE 2-3 GM-% IV SOLR
2.0000 g | INTRAVENOUS | Status: AC
  Administered 2014-02-06: 2 g via INTRAVENOUS

## 2014-02-06 MED ORDER — FENTANYL CITRATE 0.05 MG/ML IJ SOLN: 50.0000 ug | INTRAMUSCULAR | Status: DC | PRN

## 2014-02-06 MED ORDER — FENTANYL CITRATE 0.05 MG/ML IJ SOLN
INTRAMUSCULAR | Status: AC
  Filled 2014-02-06: qty 6

## 2014-02-06 MED ORDER — MIDAZOLAM HCL 2 MG/2ML IJ SOLN
INTRAMUSCULAR | Status: AC
  Filled 2014-02-06: qty 2

## 2014-02-06 MED ORDER — OXYCODONE HCL 5 MG PO TABS
ORAL_TABLET | ORAL | Status: AC
  Filled 2014-02-06: qty 1

## 2014-02-06 MED ORDER — CEFAZOLIN SODIUM-DEXTROSE 2-3 GM-% IV SOLR
INTRAVENOUS | Status: AC
  Filled 2014-02-06: qty 50

## 2014-02-06 MED ORDER — SODIUM CHLORIDE 0.9 % IV SOLN
INTRAVENOUS | Status: DC | PRN
  Administered 2014-02-06: 50 mL via INTRAMUSCULAR

## 2014-02-06 MED ORDER — DEXAMETHASONE SODIUM PHOSPHATE 4 MG/ML IJ SOLN
INTRAMUSCULAR | Status: DC | PRN
  Administered 2014-02-06: 8 mg via INTRAVENOUS

## 2014-02-06 MED ORDER — LIDOCAINE-EPINEPHRINE 0.5 %-1:200000 IJ SOLN
INTRAMUSCULAR | Status: AC
Start: 2014-02-06 — End: 2014-02-06
  Filled 2014-02-06: qty 2

## 2014-02-06 MED ORDER — MIDAZOLAM HCL 5 MG/5ML IJ SOLN
INTRAMUSCULAR | Status: DC | PRN
  Administered 2014-02-06: 2 mg via INTRAVENOUS

## 2014-02-06 MED ORDER — OXYCODONE HCL 5 MG/5ML PO SOLN: 5.0000 mg | Freq: Once | ORAL | Status: AC | PRN

## 2014-02-06 MED ORDER — PROPOFOL 10 MG/ML IV BOLUS
INTRAVENOUS | Status: DC | PRN
  Administered 2014-02-06: 180 mg via INTRAVENOUS

## 2014-02-06 MED ORDER — HYDROMORPHONE HCL PF 1 MG/ML IJ SOLN
0.2500 mg | INTRAMUSCULAR | Status: DC | PRN
  Administered 2014-02-06 (×4): 0.5 mg via INTRAVENOUS

## 2014-02-06 SURGICAL SUPPLY — 53 items
BENZOIN TINCTURE PRP APPL 2/3 (GAUZE/BANDAGES/DRESSINGS) ×6 IMPLANT
BINDER BREAST LRG (GAUZE/BANDAGES/DRESSINGS) ×3 IMPLANT
BLADE 11 SAFETY STRL DISP (BLADE) ×3 IMPLANT
BLADE HEX COATED 2.75 (ELECTRODE) ×3 IMPLANT
BLADE KNIFE PERSONA 10 (BLADE) IMPLANT
BLADE KNIFE PERSONA 15 (BLADE) ×6 IMPLANT
CANISTER SUCT 1200ML W/VALVE (MISCELLANEOUS) ×3 IMPLANT
COTTONBALL LRG STERILE PKG (GAUZE/BANDAGES/DRESSINGS) ×6 IMPLANT
COVER MAYO STAND STRL (DRAPES) ×3 IMPLANT
COVER TABLE BACK 60X90 (DRAPES) ×3 IMPLANT
DECANTER SPIKE VIAL GLASS SM (MISCELLANEOUS) ×3 IMPLANT
DRAPE LAPAROSCOPIC ABDOMINAL (DRAPES) ×3 IMPLANT
DRSG PAD ABDOMINAL 8X10 ST (GAUZE/BANDAGES/DRESSINGS) ×12 IMPLANT
ELECT REM PT RETURN 9FT ADLT (ELECTROSURGICAL) ×3
ELECTRODE REM PT RTRN 9FT ADLT (ELECTROSURGICAL) ×1 IMPLANT
FILTER 7/8 IN (FILTER) ×3 IMPLANT
GAUZE XEROFORM 1X8 LF (GAUZE/BANDAGES/DRESSINGS) ×6 IMPLANT
GAUZE XEROFORM 5X9 LF (GAUZE/BANDAGES/DRESSINGS) IMPLANT
GLOVE BIO SURGEON STRL SZ 6.5 (GLOVE) ×2 IMPLANT
GLOVE BIO SURGEONS STRL SZ 6.5 (GLOVE) ×1
GLOVE BIOGEL M STRL SZ7.5 (GLOVE) ×3 IMPLANT
GLOVE BIOGEL PI IND STRL 8 (GLOVE) ×1 IMPLANT
GLOVE BIOGEL PI INDICATOR 8 (GLOVE) ×2
GLOVE ECLIPSE 6.5 STRL STRAW (GLOVE) ×3 IMPLANT
GLOVE ECLIPSE 7.0 STRL STRAW (GLOVE) ×3 IMPLANT
GOWN STRL REUS W/ TWL LRG LVL3 (GOWN DISPOSABLE) ×1 IMPLANT
GOWN STRL REUS W/ TWL XL LVL3 (GOWN DISPOSABLE) ×2 IMPLANT
GOWN STRL REUS W/TWL LRG LVL3 (GOWN DISPOSABLE) ×2
GOWN STRL REUS W/TWL XL LVL3 (GOWN DISPOSABLE) ×4
NEEDLE HYPO 25X1 1.5 SAFETY (NEEDLE) ×3 IMPLANT
NEEDLE SPNL 18GX3.5 QUINCKE PK (NEEDLE) IMPLANT
NS IRRIG 1000ML POUR BTL (IV SOLUTION) IMPLANT
PACK BASIN DAY SURGERY FS (CUSTOM PROCEDURE TRAY) ×3 IMPLANT
PEN SKIN MARKING BROAD TIP (MISCELLANEOUS) ×3 IMPLANT
SLEEVE SCD COMPRESS KNEE MED (MISCELLANEOUS) ×3 IMPLANT
SPONGE LAP 18X18 X RAY DECT (DISPOSABLE) ×3 IMPLANT
STRIP SUTURE WOUND CLOSURE 1/2 (SUTURE) ×3 IMPLANT
SUT ETHILON 5 0 PS 2 18 (SUTURE) ×6 IMPLANT
SUT MNCRL AB 3-0 PS2 18 (SUTURE) ×6 IMPLANT
SUT MON AB 2-0 CT1 36 (SUTURE) IMPLANT
SUT PROLENE 4 0 PS 2 18 (SUTURE) ×15 IMPLANT
SYRINGE 60CC LL (MISCELLANEOUS) IMPLANT
SYRINGE CONTROL L 12CC (SYRINGE) ×3 IMPLANT
TAPE HYPAFIX 6 X30' (GAUZE/BANDAGES/DRESSINGS) ×1
TAPE HYPAFIX 6X30 (GAUZE/BANDAGES/DRESSINGS) ×2 IMPLANT
TAPE MEASURE 72IN RETRACT (INSTRUMENTS)
TAPE MEASURE LINEN 72IN RETRCT (INSTRUMENTS) IMPLANT
TOWEL OR 17X24 6PK STRL BLUE (TOWEL DISPOSABLE) ×12 IMPLANT
TUBE CONNECTING 20'X1/4 (TUBING) ×1
TUBE CONNECTING 20X1/4 (TUBING) ×2 IMPLANT
UNDERPAD 30X30 INCONTINENT (UNDERPADS AND DIAPERS) ×6 IMPLANT
VAC PENCILS W/TUBING CLEAR (MISCELLANEOUS) ×3 IMPLANT
YANKAUER SUCT BULB TIP NO VENT (SUCTIONS) ×3 IMPLANT

## 2014-02-06 NOTE — Transfer of Care (Signed)
Immediate Anesthesia Transfer of Care Note  Patient: Annette King  Procedure(s) Performed: Procedure(s):  NIPPLE / [place]  (Bilateral)  Patient Location: PACU  Anesthesia Type:General  Level of Consciousness: awake and sedated  Airway & Oxygen Therapy: Patient Spontanous Breathing and Patient connected to face mask oxygen  Post-op Assessment: Report given to PACU RN and Post -op Vital signs reviewed and stable  Post vital signs: Reviewed and stable  Complications: No apparent anesthesia complications

## 2014-02-06 NOTE — Anesthesia Preprocedure Evaluation (Addendum)
Anesthesia Evaluation  Patient identified by MRN, date of birth, ID band Patient awake    Reviewed: Allergy & Precautions, H&P , NPO status , Patient's Chart, lab work & pertinent test results  Airway Mallampati: I TM Distance: >3 FB Neck ROM: Full    Dental no notable dental hx. (+) Upper Dentures, Partial Lower, Dental Advisory Given   Pulmonary neg pulmonary ROS, former smoker,  breath sounds clear to auscultation  Pulmonary exam normal       Cardiovascular negative cardio ROS  Rhythm:Regular Rate:Normal     Neuro/Psych PSYCHIATRIC DISORDERS Anxiety Depression Bipolar Disorder negative neurological ROS     GI/Hepatic Neg liver ROS, GERD-  Medicated and Controlled,  Endo/Other  negative endocrine ROS  Renal/GU negative Renal ROS  negative genitourinary   Musculoskeletal   Abdominal   Peds  Hematology negative hematology ROS (+)   Anesthesia Other Findings   Reproductive/Obstetrics negative OB ROS                          Anesthesia Physical Anesthesia Plan  ASA: II  Anesthesia Plan: General   Post-op Pain Management:    Induction: Intravenous  Airway Management Planned: LMA  Additional Equipment:   Intra-op Plan:   Post-operative Plan: Extubation in OR  Informed Consent: I have reviewed the patients History and Physical, chart, labs and discussed the procedure including the risks, benefits and alternatives for the proposed anesthesia with the patient or authorized representative who has indicated his/her understanding and acceptance.   Dental advisory given  Plan Discussed with: CRNA  Anesthesia Plan Comments:         Anesthesia Quick Evaluation

## 2014-02-06 NOTE — H&P (Signed)
Annette King is an 53 y.o. female.   Chief Complaint: Previous bilateral implant removal and reconstructions several weeks ago [id number] bilateral implant reconstructions for breast cancer by Dr Percell Miller in [facility]  [place] Those were crinkly in nature and were replaced. Patient is doing very well and is now being prepared for bilateral nipple areolar reconstructions  Past Medical History  Diagnosis Date  . Cancer     lt breast cancer  . Depression   . Anxiety   . Bipolar disorder   . GERD (gastroesophageal reflux disease)   . Wears dentures     upper    Past Surgical History  Procedure Laterality Date  . Tonsillectomy    . Abdominal hysterectomy    . Appendectomy    . Breast surgery      bil mastectomies 2014 at [facility]  . Carpal tunnel release Bilateral   . Shoulder arthroscopy w/ rotator cuff repair Bilateral   . Breast capsulectomy with implant exchange Bilateral 11/28/2013    Procedure: REMOVAL OF BILATERAL IMPLANT POSSIBLE CAPSULECTOMIES AND IMPLANT REPLACEMENTS , REVISION OF LEFT INFRA MAMMARY FOLD,  PLICATION BILATERAL  ;  Surgeon: [name], MD;  Location: [place];  Service: Plastics;  Laterality: Bilateral;    History reviewed. No pertinent family history. Social History:  reports that she quit smoking about a year ago. She does not have any smokeless tobacco history on file. She reports that she does not drink alcohol or use illicit drugs.  Allergies: No Known Allergies  Medications Prior to Admission  Medication Sig Dispense Refill  . clonazePAM (KLONOPIN) 2 MG tablet Take 2 mg by mouth 2 (two) times daily.      . DULoxetine (CYMBALTA) 60 MG capsule Take 60 mg by mouth 2 (two) times daily.      Marland Kitchen lamoTRIgine (LAMICTAL) 100 MG tablet Take 100 mg by mouth daily.      Marland Kitchen omeprazole (PRILOSEC) 40 MG capsule Take 40 mg by mouth daily.      . traZODone (DESYREL) 100 MG tablet Take 100 mg by mouth at bedtime.        Results for  orders placed during the hospital encounter of 02/06/14 (from the past 48 hour(s))  POCT HEMOGLOBIN-HEMACUE     Status: Abnormal   Collection Time    02/06/14  7:54 AM      Result Value Ref Range   Hemoglobin 11.5 (*) 12.0 - 15.0 g/dL   No results found.  Review of Systems  Constitutional: Negative.   HENT: Negative.   Eyes: Negative.   Respiratory: Negative.   Cardiovascular: Negative.   Gastrointestinal: Negative.   Genitourinary: Negative.   Musculoskeletal: Negative.   Skin: Negative.   Neurological: Negative.   Endo/Heme/Allergies: Negative.   Psychiatric/Behavioral: Positive for depression. The patient is nervous/anxious.     Blood pressure 107/70, pulse 66, temperature 98.3 F (36.8 C), temperature source Oral, resp. rate 16, height 5' 2.5" (1.588 m), weight 51.71 kg (114 lb), SpO2 98.00%. Physical Exam   Assessment/Plan Bilateral breast cancer with reconstructions  For bilateral nipple areolar reconstructions  Annette King L 02/06/2014, 8:31 AM

## 2014-02-06 NOTE — Brief Op Note (Signed)
02/06/2014  10:23 AM  PATIENT:  Devoria Albe  53 y.o. female  PRE-OPERATIVE DIAGNOSIS:  Malignant neoplasm of breast (female), unspecified site [174.9]  POST-OPERATIVE DIAGNOSIS:  Bilateral breast cancer  PROCEDURE:  Procedure(s):  NIPPLE / [place]  (Bilateral)  SURGEON:  Surgeon(s) and Role:    * [name], MD - Primary  PHYSICIAN ASSISTANT:   ASSISTANTS: none   ANESTHESIA:   general  EBL:  Total I/O In: 1500 [I.V.:1500] Out: -   BLOOD ADMINISTERED:none  DRAINS: none   LOCAL MEDICATIONS USED:  LIDOCAINE   SPECIMEN:  Excision  DISPOSITION OF SPECIMEN:  PATHOLOGY  COUNTS:  YES  TOURNIQUET:  * No tourniquets in log *  DICTATION: .Other Dictation: Dictation Number [id number]  PLAN OF CARE: Discharge to home after PACU  PATIENT DISPOSITION:  PACU - hemodynamically stable.   Delay start of Pharmacological VTE agent (>24hrs) due to surgical blood loss or risk of bleeding: yes

## 2014-02-06 NOTE — Discharge Instructions (Signed)
Activity (include date of return to work if known) °As tolerated: NO showers °NO driving °No heavy activities ° °Diet:regular No restrictions: ° °Wound Care: Keep dressing clean & dry ° °Do not change dressings ° °Special Instructions: °Do not raise arms up °Call Doctor if any unusual problems occur such as pain, excessive °Bleeding, unrelieved Nausea/vomiting, Fever &/or chills °When lying down, keep head elevated on 2-3 pillows or back-rest ° °Follow-up appointment: Please call the office. ° °The patient received discharge instruction from:___________________________________________ ° ° ° °Patient signature ________________________________________ / Date___________ ° ° ° °Signature of individual providing instructions/ Date________________            ° ° °Post Anesthesia Home Care Instructions ° °Activity: °Get plenty of rest for the remainder of the day. A responsible adult should stay with you for 24 hours following the procedure.  °For the next 24 hours, DO NOT: °-Drive a car °-Operate machinery °-Drink alcoholic beverages °-Take any medication unless instructed by your physician °-Make any legal decisions or sign important papers. ° °Meals: °Start with liquid foods such as gelatin or soup. Progress to regular foods as tolerated. Avoid greasy, spicy, heavy foods. If nausea and/or vomiting occur, drink only clear liquids until the nausea and/or vomiting subsides. Call your physician if vomiting continues. ° °Special Instructions/Symptoms: °Your throat may feel dry or sore from the anesthesia or the breathing tube placed in your throat during surgery. If this causes discomfort, gargle with warm salt water. The discomfort should disappear within 24 hours. ° °

## 2014-02-06 NOTE — Anesthesia Procedure Notes (Signed)
Procedure Name: LMA Insertion Performed by: Dasia Guerrier W Pre-anesthesia Checklist: Patient identified, Timeout performed, Emergency Drugs available, Suction available and Patient being monitored Patient Re-evaluated:Patient Re-evaluated prior to inductionOxygen Delivery Method: Circle system utilized Preoxygenation: Pre-oxygenation with 100% oxygen Intubation Type: IV induction Ventilation: Mask ventilation without difficulty LMA: LMA inserted LMA Size: 4.0 Number of attempts: 1 Placement Confirmation: positive ETCO2 Tube secured with: Tape Dental Injury: Teeth and Oropharynx as per pre-operative assessment      

## 2014-02-06 NOTE — Anesthesia Postprocedure Evaluation (Signed)
  Anesthesia Post-op Note  Patient: Annette King  Procedure(s) Performed: Procedure(s):  NIPPLE / AOERLA COMPLEX RECONSTRUCTION BILATERAL  (Bilateral)  Patient Location: PACU  Anesthesia Type:General  Level of Consciousness: awake and alert   Airway and Oxygen Therapy: Patient Spontanous Breathing  Post-op Pain: none  Post-op Assessment: Post-op Vital signs reviewed, Patient's Cardiovascular Status Stable and Respiratory Function Stable  Post-op Vital Signs: Reviewed  Filed Vitals:   02/06/14 1100  BP:   Pulse: 66  Temp:   Resp: 20    Complications: No apparent anesthesia complications

## 2014-02-07 ENCOUNTER — Encounter (HOSPITAL_BASED_OUTPATIENT_CLINIC_OR_DEPARTMENT_OTHER): Payer: Self-pay | Admitting: Specialist

## 2014-02-08 NOTE — Op Note (Signed)
NAME:  Annette King, Annette King NO.:  [id number]  MEDICAL RECORD NO.:  [id number]  LOCATION:                                 FACILITY:  PHYSICIAN:  Berneta Sages L. Qualyn Oyervides, M.D.DATE OF BIRTH:  [birth year]  DATE OF PROCEDURE:  02/06/2014 DATE OF DISCHARGE:  02/06/2014                              OPERATIVE REPORT   A 53 year old lady status post bilateral mastectomies for breast cancer, previously done at the [facility], re-did her approximately 8 weeks ago tissue expander removal, and replacement with Gel Memory type Mentor implants and is now ready for bilateral nipple-areolar complex reconstructions.  ANESTHESIA:  General.  Preoperatively, the patient underwent drawings for the nipple-areolar complex, remarked the nipple-areolar complexes at 20 cm from the suprasternal notch.  She then underwent general anesthesia and intubated orally.  Prep was done to the chest and breast areas in routine fashion using Hibiclens soap and solution, as well as all the way down to the abdominal areas and groin for a full-thickness skin graft to be taken from both right and left groin areas.  After prep was done, sterile towels and drapes were placed so as to make a sterile field.  Xylocaine 0.5% with epinephrine 1:400,000 concentration was injected locally to the area, approximately 30 mL per side.  The right and left groin areas were excised in elliptical fashion down to the underlying subcutaneous tissue.  Hemostasis was maintained with the Bovie unit.  The superior and inferior flaps were freed up with the Bovie anticoagulation approximately 2 cm each to allow repair using deep sutures of 3-0 Monocryl x2 layers and then a running subcuticular stitch of 3-0 Monocryl.  Next, a full-thickness skin grafts were taken and defatted to be able to use as full-thickness skin grafts of the right and left areolar areas.  A skin flap was outlined with a 15-blade, and then elevated up using 5-0  Prolene.  I was able to lift it up, and then the suture areas of the base was done with 4-0 Prolene.  Next, the areola area was de-epithelialized with #15 blade after proper hemostasis.  The previously excised full-thickness skin grafts were placed over the defect and secured with 4-0 Prolene at 3, 6, 9, and 12 o'clock positions with stay suture left long.  Next, the edges were reapproximated with a running 4-0 Prolene suture.  The opening was opened with an #11 blade to allow the nipple to come back nicely on each side, and sterile dressings were then tied down securely after Xeroform, fluffy dressing of 4x4s, ABDs, and Hypafix tape.  The donor sites were covered with half-inch Steri-Strips, Xeroform, 4x4s, ABDs, and Hypafix tape.  She tolerated all the procedures very well, was taken to recovery in excellent condition.  ESTIMATED BLOOD LOSS:  Less than 150 mL.  COMPLICATIONS:  None.    Odella Aquas. Towanda Malkin, M.D.    Elie Confer  D:  02/06/2014  T:  02/07/2014  Job:  [id number]

## 2022-01-25 ENCOUNTER — Emergency Department (HOSPITAL_BASED_OUTPATIENT_CLINIC_OR_DEPARTMENT_OTHER): Payer: Medicare Other

## 2022-01-25 ENCOUNTER — Emergency Department (HOSPITAL_BASED_OUTPATIENT_CLINIC_OR_DEPARTMENT_OTHER)
Admission: EM | Admit: 2022-01-25 | Discharge: 2022-01-25 | Disposition: A | Discharge status: Home / Self Care | Payer: Medicare Other | Attending: Emergency Medicine | Admitting: Emergency Medicine

## 2022-01-25 ENCOUNTER — Encounter (HOSPITAL_BASED_OUTPATIENT_CLINIC_OR_DEPARTMENT_OTHER): Payer: Self-pay | Admitting: Emergency Medicine

## 2022-01-25 DIAGNOSIS — M5441 Lumbago with sciatica, right side: Secondary | ICD-10-CM

## 2022-01-25 DIAGNOSIS — Z853 Personal history of malignant neoplasm of breast: Secondary | ICD-10-CM | POA: Diagnosis not present

## 2022-01-25 DIAGNOSIS — M5442 Lumbago with sciatica, left side: Secondary | ICD-10-CM | POA: Diagnosis not present

## 2022-01-25 DIAGNOSIS — M545 Low back pain, unspecified: Secondary | ICD-10-CM | POA: Diagnosis present

## 2022-01-25 HISTORY — DX: Cerebral infarction, unspecified: I63.9

## 2022-01-25 MED ORDER — OXYCODONE-ACETAMINOPHEN 5-325 MG PO TABS
1.0000 | ORAL_TABLET | Freq: Once | ORAL | Status: AC
  Administered 2022-01-25: 1 via ORAL
  Filled 2022-01-25: qty 1

## 2022-01-25 MED ORDER — PREDNISONE 20 MG PO TABS: 40.0000 mg | ORAL_TABLET | Freq: Every day | ORAL | 0 refills | Status: AC

## 2022-01-25 MED ORDER — HYDROMORPHONE HCL 1 MG/ML IJ SOLN
1.0000 mg | Freq: Once | INTRAMUSCULAR | Status: AC
  Administered 2022-01-25: 1 mg via INTRAMUSCULAR
  Filled 2022-01-25: qty 1

## 2022-01-25 MED ORDER — METHOCARBAMOL 500 MG PO TABS: 500.0000 mg | ORAL_TABLET | Freq: Three times a day (TID) | ORAL | 0 refills | Status: AC | PRN

## 2022-01-25 MED ORDER — METHOCARBAMOL 500 MG PO TABS
500.0000 mg | ORAL_TABLET | Freq: Once | ORAL | Status: AC
  Administered 2022-01-25: 500 mg via ORAL
  Filled 2022-01-25: qty 1

## 2022-01-25 NOTE — ED Triage Notes (Signed)
Pt reports midline lower back pain radiating down L leg. Also having pain and tingling in R leg below knee. No known injury. Hx of sciatica on R side. Ambulatory to triage.

## 2022-01-25 NOTE — Discharge Instructions (Addendum)
Follow-up with your doctors.  Hopefully the muscle relaxer and the steroid should help.  A lidocaine patch she can get over-the-counter at the pharmacy may also help

## 2022-01-25 NOTE — ED Notes (Signed)
Presents with back pain, primarily on left, but pain did originate on rt, has pain increase with placing wt on left leg. Has strong plantar and dorsal flexion bilaterally. Denies any falls or injuries.

## 2022-01-25 NOTE — ED Notes (Signed)
Signature pad not working. Pt verbalized understanding of discharge instructions 

## 2022-01-25 NOTE — ED Provider Notes (Signed)
[place] EMERGENCY DEPARTMENT Provider Note   CSN: [id number] Arrival date & time: 01/25/22  1216     History  Chief Complaint  Patient presents with   Back Pain   Leg Pain    Annette King is a 61 y.o. female.   Back Pain Associated symptoms: leg pain   Associated symptoms: no numbness and no weakness   Leg Pain Associated symptoms: back pain   Patient presents with low back pain and radiation down the leg.  Has had sciatica in the past.  Had flared up somewhat recently but now 1 from right leg also down the left leg.  Goes down to the left calf.  Also tingling on the right lower leg.  No new injury.  Is on Neurontin.  Has had neck issues in the past 2.  Has had somewhat recent stroke and is on Plavix and has a cardiac monitor. Also previous history of breast cancer years ago.  Cancer free as far she knows now.    Past Medical History:  Diagnosis Date   Anxiety    Bipolar disorder ([place])    Cancer ([place])    lt breast cancer   Depression    GERD (gastroesophageal reflux disease)    Stroke ([place])    Wears dentures    upper    Home Medications Prior to Admission medications   Medication Sig Start Date End Date Taking? Authorizing Provider  methocarbamol (ROBAXIN) 500 MG tablet Take 1 tablet (500 mg total) by mouth every 8 (eight) hours as needed for muscle spasms. 01/25/22  Yes Davonna Belling, MD  predniSONE (DELTASONE) 20 MG tablet Take 2 tablets (40 mg total) by mouth daily. 01/25/22  Yes Davonna Belling, MD  clonazePAM (KLONOPIN) 2 MG tablet Take 2 mg by mouth 2 (two) times daily.    [provider]  DULoxetine (CYMBALTA) 60 MG capsule Take 60 mg by mouth 2 (two) times daily.    [provider]  lamoTRIgine (LAMICTAL) 100 MG tablet Take 100 mg by mouth daily.    [provider]  omeprazole (PRILOSEC) 40 MG capsule Take 40 mg by mouth daily.    [provider]  traZODone (DESYREL) 100 MG tablet Take 100 mg by mouth  at bedtime.    [provider]      Allergies    Patient has no known allergies.    Review of Systems   Review of Systems  Constitutional:  Negative for appetite change.  Respiratory:  Negative for shortness of breath.   Musculoskeletal:  Positive for back pain.  Neurological:  Negative for weakness and numbness.    Physical Exam Updated Vital Signs BP 123/65 (BP Location: Right Arm)   Pulse 60   Temp 98.2 F (36.8 C) (Oral)   Resp 16   Ht '5\' 2"'$  (1.575 m)   Wt 53.5 kg   SpO2 98%   BMI 21.58 kg/m  Physical Exam Vitals and nursing note reviewed.  HENT:     Head: Atraumatic.  Cardiovascular:     Rate and Rhythm: Regular rhythm.  Abdominal:     Tenderness: There is no abdominal tenderness.  Musculoskeletal:        General: Tenderness present.     Cervical back: Neck supple.     Comments: Lumbar tenderness and lumbar paraspinal/muscular tenderness.  No rash.  Pain with straight leg raise bilaterally but appears to be worse on the left.  Skin:    General: Skin is warm.  Capillary Refill: Capillary refill takes less than 2 seconds.  Neurological:     Mental Status: She is alert.     ED Results / Procedures / Treatments   Labs (all labs ordered are listed, but only abnormal results are displayed) Labs Reviewed - No data to display  EKG None  Radiology CT Lumbar Spine Wo Contrast  Result Date: 01/25/2022 CLINICAL DATA:  Low back pain EXAM: CT LUMBAR SPINE WITHOUT CONTRAST TECHNIQUE: Multidetector CT imaging of the lumbar spine was performed without intravenous contrast administration. Multiplanar CT image reconstructions were also generated. RADIATION DOSE REDUCTION: This exam was performed according to the departmental dose-optimization program which includes automated exposure control, adjustment of the mA and/or kV according to patient size and/or use of iterative reconstruction technique. COMPARISON:  04/30/2014. FINDINGS: Segmentation: 5 lumbar type  vertebrae. Alignment: Normal. Vertebrae: No acute fracture.  No lytic or sclerotic bony lesion. Paraspinal and other soft tissues: Aortic atherosclerosis. No acute findings. Disc levels: Mild disc height loss at L2-3 and L5-S1. Relatively mild facet joint arthropathy. There appears to be at least mild foraminal stenosis on the right at L4-5 and on the left at L5-S1. Focal right paracentral disc protrusion at L3-4 (series 3, image 60). Mild diffuse disc bulges at L2-3, L4-5, and L5-S1. No evidence to suggest high-grade canal stenosis by CT. IMPRESSION: 1. No acute fracture or traumatic malalignment of the lumbar spine. No suspicious bone lesion. 2. Multilevel lumbar spondylosis including focal right paracentral disc protrusion at L3-4 without evidence to suggest high-grade canal stenosis by CT. Aortic Atherosclerosis (ICD10-I70.0). Electronically Signed   By: Davina Poke D.O.   On: 01/25/2022 16:39    Procedures Procedures    Medications Ordered in ED Medications  oxyCODONE-acetaminophen (PERCOCET/ROXICET) 5-325 MG per tablet 1 tablet (1 tablet Oral Given 01/25/22 1526)  methocarbamol (ROBAXIN) tablet 500 mg (500 mg Oral Given 01/25/22 1526)  HYDROmorphone (DILAUDID) injection 1 mg (1 mg Intramuscular Given 01/25/22 1633)    ED Course/ Medical Decision Making/ A&P                           Medical Decision Making Amount and/or Complexity of Data Reviewed Radiology: ordered.  Risk Prescription drug management.   Patient presents with low back pain.  Somewhat acute on chronic.  Has had previous right-sided sciatica but now more on the left side.  No fevers or chills.  No IV drug use.  No fevers.  No direct injury.  Although does have a remote cancer history.  Not relieved with medications at home.  States she cannot do NSAIDs due to her kidney function.  Will get CT exam to evaluate lumbar spine.  I think with the higher risk from her previous cancer history she is a good candidate for this.   We will also treat symptomatically with some pills at this time and hopefully steroids for discharge.  Feels somewhat better after treatment.  We will treat with steroids and muscle relaxers.  Also lidocaine patch may help.  CT scan reviewed and showed multilevel disease but nothing that needs acute intervention.  Will discharge home to follow-up with her doctors.  Some the other causes such as AAA, kidney stone, intra-abdominal infection felt less likely.        Final Clinical Impression(s) / ED Diagnoses Final diagnoses:  Acute bilateral low back pain with bilateral sciatica    Rx / DC Orders ED Discharge Orders  Ordered    methocarbamol (ROBAXIN) 500 MG tablet  Every 8 hours PRN        01/25/22 1656    predniSONE (DELTASONE) 20 MG tablet  Daily        01/25/22 1656              Davonna Belling, MD 01/25/22 1709

## 2022-08-09 ENCOUNTER — Other Ambulatory Visit: Payer: Self-pay

## 2022-08-09 ENCOUNTER — Emergency Department (HOSPITAL_BASED_OUTPATIENT_CLINIC_OR_DEPARTMENT_OTHER): Payer: 59

## 2022-08-09 DIAGNOSIS — Y900 Blood alcohol level of less than 20 mg/100 ml: Secondary | ICD-10-CM | POA: Diagnosis not present

## 2022-08-09 DIAGNOSIS — R202 Paresthesia of skin: Secondary | ICD-10-CM | POA: Diagnosis not present

## 2022-08-09 DIAGNOSIS — I517 Cardiomegaly: Secondary | ICD-10-CM | POA: Diagnosis not present

## 2022-08-09 DIAGNOSIS — R200 Anesthesia of skin: Secondary | ICD-10-CM | POA: Diagnosis not present

## 2022-08-09 DIAGNOSIS — Z5321 Procedure and treatment not carried out due to patient leaving prior to being seen by health care provider: Secondary | ICD-10-CM | POA: Diagnosis not present

## 2022-08-09 DIAGNOSIS — R531 Weakness: Secondary | ICD-10-CM | POA: Diagnosis not present

## 2022-08-09 LAB — COMPREHENSIVE METABOLIC PANEL
ALT: 12 U/L (ref 0–44)
AST: 16 U/L (ref 15–41)
Albumin: 3.7 g/dL (ref 3.5–5.0)
Alkaline Phosphatase: 85 U/L (ref 38–126)
Anion gap: 5 (ref 5–15)
BUN: 19 mg/dL (ref 8–23)
CO2: 27 mmol/L (ref 22–32)
Calcium: 8.4 mg/dL — ABNORMAL LOW (ref 8.9–10.3)
Chloride: 101 mmol/L (ref 98–111)
Creatinine, Ser: 1.25 mg/dL — ABNORMAL HIGH (ref 0.44–1.00)
GFR, Estimated: 49 mL/min — ABNORMAL LOW (ref >60)
Glucose, Bld: 79 mg/dL (ref 70–99)
Potassium: 3.9 mmol/L (ref 3.5–5.1)
Sodium: 133 mmol/L — ABNORMAL LOW (ref 135–145)
Total Bilirubin: 0.2 mg/dL — ABNORMAL LOW (ref 0.3–1.2)
Total Protein: 6.7 g/dL (ref 6.5–8.1)

## 2022-08-09 LAB — CBC
HCT: 36.3 % (ref 36.0–46.0)
Hemoglobin: 11.9 g/dL — ABNORMAL LOW (ref 12.0–15.0)
MCH: 30.8 pg (ref 26.0–34.0)
MCHC: 32.8 g/dL (ref 30.0–36.0)
MCV: 94 fL (ref 80.0–100.0)
Platelets: 195 10*3/uL (ref 150–400)
RBC: 3.86 MIL/uL — ABNORMAL LOW (ref 3.87–5.11)
RDW: 14.8 % (ref 11.5–15.5)
WBC: 5.2 10*3/uL (ref 4.0–10.5)
nRBC: 0 % (ref 0.0–0.2)

## 2022-08-09 LAB — DIFFERENTIAL
Abs Immature Granulocytes: 0.01 10*3/uL (ref 0.00–0.07)
Basophils Absolute: 0 10*3/uL (ref 0.0–0.1)
Basophils Relative: 1 %
Eosinophils Absolute: 0.3 10*3/uL (ref 0.0–0.5)
Eosinophils Relative: 5 %
Immature Granulocytes: 0 %
Lymphocytes Relative: 41 %
Lymphs Abs: 2.2 10*3/uL (ref 0.7–4.0)
Monocytes Absolute: 0.5 10*3/uL (ref 0.1–1.0)
Monocytes Relative: 10 %
Neutro Abs: 2.2 10*3/uL (ref 1.7–7.7)
Neutrophils Relative %: 43 %

## 2022-08-09 LAB — PROTIME-INR
INR: 0.9 (ref 0.8–1.2)
Prothrombin Time: 12.3 seconds (ref 11.4–15.2)

## 2022-08-09 LAB — APTT: aPTT: 27 seconds (ref 24–36)

## 2022-08-09 LAB — ETHANOL: Alcohol, Ethyl (B): <10 mg/dL (ref <10)

## 2022-08-09 NOTE — ED Triage Notes (Signed)
Patient presents to ED via POV from home. Here with left sided numbness and weakness that began at 0730 yesterday. Patient was seen at [facility] but they did not do a CT per patient. No facial droop noted. Left side strength 4/5 compared to right which is 5/5. Endorses numbness to left side of face and arm.

## 2022-08-10 ENCOUNTER — Emergency Department (HOSPITAL_BASED_OUTPATIENT_CLINIC_OR_DEPARTMENT_OTHER)
Admission: EM | Admit: 2022-08-10 | Discharge: 2022-08-10 | Discharge status: Against Medical Advice | Payer: 59 | Attending: Emergency Medicine | Admitting: Emergency Medicine

## 2022-08-10 NOTE — ED Notes (Signed)
Pt called, not seen in Lobby or adjacent areas.
